# Patient Record
Sex: Female | Born: 1955 | Race: Black or African American | Hispanic: No | Marital: Married | Smoking: Former smoker
Health system: Southern US, Community
[De-identification: ages and names within clinical notes are randomized; demographics above are authoritative.]

## PROBLEM LIST (undated history)

## (undated) DIAGNOSIS — I10 Essential (primary) hypertension: Secondary | ICD-10-CM

## (undated) DIAGNOSIS — Z972 Presence of dental prosthetic device (complete) (partial): Secondary | ICD-10-CM

## (undated) DIAGNOSIS — E119 Type 2 diabetes mellitus without complications: Secondary | ICD-10-CM

## (undated) HISTORY — PX: NO PAST SURGERIES: SHX2092

---

## 1999-02-16 ENCOUNTER — Emergency Department (HOSPITAL_COMMUNITY): Admission: EM | Admit: 1999-02-16 | Discharge: 1999-02-16 | Payer: Self-pay | Admitting: Emergency Medicine

## 2009-01-23 ENCOUNTER — Emergency Department (HOSPITAL_COMMUNITY): Admission: EM | Admit: 2009-01-23 | Discharge: 2009-01-23 | Payer: Self-pay | Admitting: Emergency Medicine

## 2009-02-12 ENCOUNTER — Emergency Department (HOSPITAL_COMMUNITY): Admission: EM | Admit: 2009-02-12 | Discharge: 2009-02-12 | Payer: Self-pay | Admitting: Emergency Medicine
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
## 2010-07-12 LAB — CBC
HCT: 36.9 % (ref 36.0–46.0)
Hemoglobin: 12.1 g/dL (ref 12.0–15.0)
MCHC: 32.9 g/dL (ref 30.0–36.0)
Platelets: 225 10*3/uL (ref 150–400)
RBC: 3.98 MIL/uL (ref 3.87–5.11)
RDW: 14.3 % (ref 11.5–15.5)

## 2010-07-12 LAB — POCT I-STAT, CHEM 8
Chloride: 102 mEq/L (ref 96–112)
Creatinine, Ser: 1 mg/dL (ref 0.4–1.2)
HCT: 39 % (ref 36.0–46.0)
Potassium: 3.4 mEq/L — ABNORMAL LOW (ref 3.5–5.1)
Sodium: 142 mEq/L (ref 135–145)
TCO2: 32 mmol/L (ref 0–100)

## 2010-07-12 LAB — DIFFERENTIAL
Basophils Absolute: 0 10*3/uL (ref 0.0–0.1)
Basophils Relative: 0 % (ref 0–1)
Monocytes Absolute: 0.5 10*3/uL (ref 0.1–1.0)
Monocytes Relative: 5 % (ref 3–12)
Neutro Abs: 7.2 10*3/uL (ref 1.7–7.7)

## 2013-11-09 ENCOUNTER — Other Ambulatory Visit: Payer: Self-pay | Admitting: Orthopedic Surgery

## 2013-11-09 DIAGNOSIS — M25532 Pain in left wrist: Secondary | ICD-10-CM

## 2013-11-18 ENCOUNTER — Ambulatory Visit
Admission: RE | Admit: 2013-11-18 | Discharge: 2013-11-18 | Disposition: A | Discharge status: Home / Self Care | Payer: Worker's Compensation | Source: Ambulatory Visit | Attending: Orthopedic Surgery | Admitting: Orthopedic Surgery

## 2013-11-18 ENCOUNTER — Other Ambulatory Visit: Payer: Self-pay

## 2013-11-18 DIAGNOSIS — M25532 Pain in left wrist: Secondary | ICD-10-CM

## 2014-01-18 ENCOUNTER — Other Ambulatory Visit: Payer: Self-pay | Admitting: Orthopedic Surgery

## 2014-01-18 ENCOUNTER — Encounter (HOSPITAL_BASED_OUTPATIENT_CLINIC_OR_DEPARTMENT_OTHER): Payer: Self-pay | Admitting: *Deleted

## 2014-01-18 NOTE — Progress Notes (Signed)
01/18/14 1208  OBSTRUCTIVE SLEEP APNEA  Have you ever been diagnosed with sleep apnea through a sleep study? No  Do you snore loudly (loud enough to be heard through closed doors)?  1  Do you often feel tired, fatigued, or sleepy during the daytime? 0  Has anyone observed you stop breathing during your sleep? 0  Do you have, or are you being treated for high blood pressure? 1  BMI more than 35 kg/m2? 1  Age over 58 years old? 1  Neck circumference greater than 40 cm/16 inches? 1  Gender: 0  Obstructive Sleep Apnea Score 5  Score 4 or greater  Results sent to PCP

## 2014-01-18 NOTE — Progress Notes (Signed)
Pt has never been sedated for anything-never had ekg-to go to [facility] for ekg-bmet

## 2014-01-19 ENCOUNTER — Encounter (HOSPITAL_COMMUNITY)
Admission: RE | Admit: 2014-01-19 | Discharge: 2014-01-19 | Disposition: A | Discharge status: Home / Self Care | Payer: Worker's Compensation | Source: Ambulatory Visit | Attending: Orthopedic Surgery | Admitting: Orthopedic Surgery

## 2014-01-19 ENCOUNTER — Other Ambulatory Visit: Payer: Self-pay

## 2014-01-19 DIAGNOSIS — G5602 Carpal tunnel syndrome, left upper limb: Secondary | ICD-10-CM | POA: Diagnosis not present

## 2014-01-19 DIAGNOSIS — I10 Essential (primary) hypertension: Secondary | ICD-10-CM | POA: Diagnosis not present

## 2014-01-19 DIAGNOSIS — S62152K Displaced fracture of hook process of hamate [unciform] bone, left wrist, subsequent encounter for fracture with nonunion: Secondary | ICD-10-CM | POA: Diagnosis not present

## 2014-01-19 DIAGNOSIS — R2232 Localized swelling, mass and lump, left upper limb: Secondary | ICD-10-CM | POA: Diagnosis present

## 2014-01-19 DIAGNOSIS — E119 Type 2 diabetes mellitus without complications: Secondary | ICD-10-CM | POA: Diagnosis not present

## 2014-01-19 DIAGNOSIS — Z87891 Personal history of nicotine dependence: Secondary | ICD-10-CM | POA: Diagnosis not present

## 2014-01-19 DIAGNOSIS — M728 Other fibroblastic disorders: Secondary | ICD-10-CM | POA: Diagnosis not present

## 2014-01-19 DIAGNOSIS — X58XXXD Exposure to other specified factors, subsequent encounter: Secondary | ICD-10-CM | POA: Diagnosis not present

## 2014-01-19 LAB — BASIC METABOLIC PANEL
Anion gap: 10 (ref 5–15)
BUN: 11 mg/dL (ref 6–23)
CALCIUM: 9.3 mg/dL (ref 8.4–10.5)
CO2: 27 mEq/L (ref 19–32)
CREATININE: 1.16 mg/dL — AB (ref 0.50–1.10)
Chloride: 107 mEq/L (ref 96–112)
GFR calc non Af Amer: 51 mL/min — ABNORMAL LOW (ref >90)
GFR, EST AFRICAN AMERICAN: 59 mL/min — AB (ref >90)
Glucose, Bld: 129 mg/dL — ABNORMAL HIGH (ref 70–99)
Potassium: 4.3 mEq/L (ref 3.7–5.3)
Sodium: 144 mEq/L (ref 137–147)

## 2014-01-20 ENCOUNTER — Ambulatory Visit (HOSPITAL_BASED_OUTPATIENT_CLINIC_OR_DEPARTMENT_OTHER): Payer: Worker's Compensation | Admitting: Certified Registered"

## 2014-01-20 ENCOUNTER — Ambulatory Visit (HOSPITAL_BASED_OUTPATIENT_CLINIC_OR_DEPARTMENT_OTHER)
Admission: RE | Admit: 2014-01-20 | Discharge: 2014-01-20 | Disposition: A | Discharge status: Home / Self Care | Payer: Worker's Compensation | Source: Ambulatory Visit | Attending: Orthopedic Surgery | Admitting: Orthopedic Surgery

## 2014-01-20 ENCOUNTER — Encounter (HOSPITAL_BASED_OUTPATIENT_CLINIC_OR_DEPARTMENT_OTHER): Payer: Self-pay

## 2014-01-20 ENCOUNTER — Encounter (HOSPITAL_BASED_OUTPATIENT_CLINIC_OR_DEPARTMENT_OTHER)
Admission: RE | Disposition: A | Discharge status: Home / Self Care | Payer: Self-pay | Source: Ambulatory Visit | Attending: Orthopedic Surgery

## 2014-01-20 ENCOUNTER — Encounter (HOSPITAL_BASED_OUTPATIENT_CLINIC_OR_DEPARTMENT_OTHER): Payer: Worker's Compensation | Admitting: Certified Registered"

## 2014-01-20 DIAGNOSIS — G5602 Carpal tunnel syndrome, left upper limb: Secondary | ICD-10-CM | POA: Insufficient documentation

## 2014-01-20 DIAGNOSIS — I10 Essential (primary) hypertension: Secondary | ICD-10-CM | POA: Insufficient documentation

## 2014-01-20 DIAGNOSIS — M728 Other fibroblastic disorders: Secondary | ICD-10-CM | POA: Diagnosis not present

## 2014-01-20 DIAGNOSIS — E119 Type 2 diabetes mellitus without complications: Secondary | ICD-10-CM | POA: Insufficient documentation

## 2014-01-20 DIAGNOSIS — S62152K Displaced fracture of hook process of hamate [unciform] bone, left wrist, subsequent encounter for fracture with nonunion: Secondary | ICD-10-CM | POA: Insufficient documentation

## 2014-01-20 DIAGNOSIS — X58XXXD Exposure to other specified factors, subsequent encounter: Secondary | ICD-10-CM | POA: Insufficient documentation

## 2014-01-20 DIAGNOSIS — Z87891 Personal history of nicotine dependence: Secondary | ICD-10-CM | POA: Insufficient documentation

## 2014-01-20 HISTORY — DX: Essential (primary) hypertension: I10

## 2014-01-20 HISTORY — DX: Type 2 diabetes mellitus without complications: E11.9

## 2014-01-20 HISTORY — DX: Presence of dental prosthetic device (complete) (partial): Z97.2

## 2014-01-20 LAB — GLUCOSE, CAPILLARY
Glucose-Capillary: 107 mg/dL — ABNORMAL HIGH (ref 70–99)
Glucose-Capillary: 112 mg/dL — ABNORMAL HIGH (ref 70–99)

## 2014-01-20 LAB — POCT HEMOGLOBIN-HEMACUE: Hemoglobin: 12.6 g/dL (ref 12.0–15.0)

## 2014-01-20 SURGERY — EXCISION, CARPAL BONE
Anesthesia: General | Site: Wrist | Laterality: Left

## 2014-01-20 MED ORDER — HYDROMORPHONE HCL 2 MG PO TABS: 2.0000 mg | ORAL_TABLET | Freq: Four times a day (QID) | ORAL | Status: AC | PRN

## 2014-01-20 MED ORDER — PROPOFOL 10 MG/ML IV BOLUS
INTRAVENOUS | Status: DC | PRN
  Administered 2014-01-20: 200 mg via INTRAVENOUS

## 2014-01-20 MED ORDER — CHLORHEXIDINE GLUCONATE 4 % EX LIQD: 60.0000 mL | Freq: Once | CUTANEOUS | Status: DC

## 2014-01-20 MED ORDER — DEXAMETHASONE SODIUM PHOSPHATE 10 MG/ML IJ SOLN
INTRAMUSCULAR | Status: DC | PRN
  Administered 2014-01-20: 4 mg via INTRAVENOUS

## 2014-01-20 MED ORDER — FENTANYL CITRATE 0.05 MG/ML IJ SOLN
INTRAMUSCULAR | Status: AC
  Filled 2014-01-20: qty 6

## 2014-01-20 MED ORDER — OXYCODONE HCL 5 MG PO TABS: 5.0000 mg | ORAL_TABLET | Freq: Once | ORAL | Status: DC | PRN

## 2014-01-20 MED ORDER — ONDANSETRON HCL 4 MG/2ML IJ SOLN
INTRAMUSCULAR | Status: DC | PRN
  Administered 2014-01-20: 4 mg via INTRAVENOUS

## 2014-01-20 MED ORDER — BUPIVACAINE-EPINEPHRINE (PF) 0.5% -1:200000 IJ SOLN
INTRAMUSCULAR | Status: DC | PRN
  Administered 2014-01-20: 25 mL via PERINEURAL

## 2014-01-20 MED ORDER — CEFAZOLIN SODIUM-DEXTROSE 2-3 GM-% IV SOLR: 2.0000 g | INTRAVENOUS | Status: DC

## 2014-01-20 MED ORDER — HYDROMORPHONE HCL 1 MG/ML IJ SOLN: 0.2500 mg | INTRAMUSCULAR | Status: DC | PRN

## 2014-01-20 MED ORDER — ONDANSETRON HCL 4 MG/2ML IJ SOLN: 4.0000 mg | Freq: Once | INTRAMUSCULAR | Status: DC | PRN

## 2014-01-20 MED ORDER — OXYCODONE HCL 5 MG/5ML PO SOLN: 5.0000 mg | Freq: Once | ORAL | Status: DC | PRN

## 2014-01-20 MED ORDER — FENTANYL CITRATE 0.05 MG/ML IJ SOLN
INTRAMUSCULAR | Status: AC
  Filled 2014-01-20: qty 2

## 2014-01-20 MED ORDER — CEFAZOLIN SODIUM-DEXTROSE 2-3 GM-% IV SOLR
INTRAVENOUS | Status: AC
  Filled 2014-01-20: qty 50

## 2014-01-20 MED ORDER — TRAMADOL HCL 50 MG PO TABS: ORAL_TABLET | ORAL | Status: AC

## 2014-01-20 MED ORDER — LIDOCAINE HCL (CARDIAC) 20 MG/ML IV SOLN
INTRAVENOUS | Status: DC | PRN
Start: 2014-01-20 — End: 2014-01-20
  Administered 2014-01-20: 100 mg via INTRAVENOUS

## 2014-01-20 MED ORDER — MIDAZOLAM HCL 2 MG/2ML IJ SOLN
1.0000 mg | INTRAMUSCULAR | Status: DC | PRN
  Administered 2014-01-20: 2 mg via INTRAVENOUS

## 2014-01-20 MED ORDER — MIDAZOLAM HCL 2 MG/2ML IJ SOLN
INTRAMUSCULAR | Status: AC
  Filled 2014-01-20: qty 2

## 2014-01-20 MED ORDER — FENTANYL CITRATE 0.05 MG/ML IJ SOLN
50.0000 ug | INTRAMUSCULAR | Status: DC | PRN
  Administered 2014-01-20: 100 ug via INTRAVENOUS

## 2014-01-20 MED ORDER — LACTATED RINGERS IV SOLN
INTRAVENOUS | Status: DC
  Administered 2014-01-20 (×2): via INTRAVENOUS

## 2014-01-20 SURGICAL SUPPLY — 54 items
BANDAGE ELASTIC 3 VELCRO ST LF (GAUZE/BANDAGES/DRESSINGS) ×3 IMPLANT
BLADE CCA MICRO SAG (BLADE) ×1 IMPLANT
BLADE MINI RND TIP GREEN BEAV (BLADE) ×5 IMPLANT
BLADE SURG 15 STRL LF DISP TIS (BLADE) ×2 IMPLANT
BLADE SURG 15 STRL SS (BLADE) ×6
BNDG CMPR 9X4 STRL LF SNTH (GAUZE/BANDAGES/DRESSINGS) ×1
BNDG ESMARK 4X9 LF (GAUZE/BANDAGES/DRESSINGS) ×3 IMPLANT
BNDG GAUZE ELAST 4 BULKY (GAUZE/BANDAGES/DRESSINGS) ×3 IMPLANT
CHLORAPREP W/TINT 26ML (MISCELLANEOUS) ×3 IMPLANT
CORDS BIPOLAR (ELECTRODE) ×3 IMPLANT
COVER BACK TABLE 60X90IN (DRAPES) ×3 IMPLANT
COVER MAYO STAND STRL (DRAPES) ×3 IMPLANT
CUFF TOURNIQUET SINGLE 18IN (TOURNIQUET CUFF) IMPLANT
CUFF TOURNIQUET SINGLE 24IN (TOURNIQUET CUFF) ×2 IMPLANT
DECANTER SPIKE VIAL GLASS SM (MISCELLANEOUS) IMPLANT
DRAPE EXTREMITY TIBURON (DRAPES) ×3 IMPLANT
DRAPE OEC MINIVIEW 54X84 (DRAPES) ×3 IMPLANT
DRAPE SURG 17X23 STRL (DRAPES) ×3 IMPLANT
GAUZE SPONGE 4X4 12PLY STRL (GAUZE/BANDAGES/DRESSINGS) ×3 IMPLANT
GAUZE XEROFORM 1X8 LF (GAUZE/BANDAGES/DRESSINGS) ×3 IMPLANT
GLOVE BIO SURGEON STRL SZ 6.5 (GLOVE) ×3 IMPLANT
GLOVE BIO SURGEON STRL SZ7.5 (GLOVE) ×3 IMPLANT
GLOVE BIO SURGEONS STRL SZ 6.5 (GLOVE) ×3
GLOVE BIOGEL PI IND STRL 7.0 (GLOVE) IMPLANT
GLOVE BIOGEL PI IND STRL 8 (GLOVE) ×1 IMPLANT
GLOVE BIOGEL PI IND STRL 8.5 (GLOVE) IMPLANT
GLOVE BIOGEL PI INDICATOR 7.0 (GLOVE) ×6
GLOVE BIOGEL PI INDICATOR 8 (GLOVE) ×2
GLOVE BIOGEL PI INDICATOR 8.5 (GLOVE) ×2
GLOVE ECLIPSE 6.5 STRL STRAW (GLOVE) ×2 IMPLANT
GLOVE SURG ORTHO 8.0 STRL STRW (GLOVE) ×3 IMPLANT
GOWN STRL REUS W/ TWL LRG LVL3 (GOWN DISPOSABLE) ×1 IMPLANT
GOWN STRL REUS W/TWL LRG LVL3 (GOWN DISPOSABLE) ×9
GOWN STRL REUS W/TWL XL LVL3 (GOWN DISPOSABLE) ×6 IMPLANT
NS IRRIG 1000ML POUR BTL (IV SOLUTION) ×3 IMPLANT
PACK BASIN DAY SURGERY FS (CUSTOM PROCEDURE TRAY) ×3 IMPLANT
PAD CAST 3X4 CTTN HI CHSV (CAST SUPPLIES) ×1 IMPLANT
PADDING CAST ABS 3INX4YD NS (CAST SUPPLIES)
PADDING CAST ABS 4INX4YD NS (CAST SUPPLIES)
PADDING CAST ABS COTTON 3X4 (CAST SUPPLIES) IMPLANT
PADDING CAST ABS COTTON 4X4 ST (CAST SUPPLIES) ×1 IMPLANT
PADDING CAST COTTON 3X4 STRL (CAST SUPPLIES) ×3
SLEEVE SCD COMPRESS KNEE MED (MISCELLANEOUS) ×4 IMPLANT
SLING ARM LRG ADULT FOAM STRAP (SOFTGOODS) ×2 IMPLANT
SPLINT PLASTER CAST XFAST 3X15 (CAST SUPPLIES) ×10 IMPLANT
SPLINT PLASTER XTRA FASTSET 3X (CAST SUPPLIES) ×20
STOCKINETTE 4X48 STRL (DRAPES) ×3 IMPLANT
SUT ETHILON 4 0 PS 2 18 (SUTURE) ×3 IMPLANT
SUT VIC AB 4-0 P2 18 (SUTURE) ×1 IMPLANT
SUT VICRYL 4-0 PS2 18IN ABS (SUTURE) ×1 IMPLANT
SYR BULB 3OZ (MISCELLANEOUS) ×3 IMPLANT
SYR CONTROL 10ML LL (SYRINGE) IMPLANT
TOWEL OR 17X24 6PK STRL BLUE (TOWEL DISPOSABLE) ×3 IMPLANT
UNDERPAD 30X30 INCONTINENT (UNDERPADS AND DIAPERS) ×1 IMPLANT

## 2014-01-20 NOTE — Progress Notes (Signed)
  Assisted Dr. Crews with left, ultrasound guided, supraclavicular block. Side rails up, monitors on throughout procedure. See vital signs in flow sheet. Tolerated Procedure well. 

## 2014-01-20 NOTE — Op Note (Signed)
NAME:  [name] Jordan, [name] Jordan              ACCOUNT NO.:  [id number]  MEDICAL RECORD NO.:  [id number]  LOCATION:                               FACILITY:  [facility]  PHYSICIAN:  Xaniyah Buchholz, MD        DATE OF BIRTH:  09/21/1955  DATE OF PROCEDURE:  01/20/2014 DATE OF DISCHARGE:  01/20/2014                              OPERATIVE REPORT   PREOPERATIVE DIAGNOSIS:  Left palm mass and trapezial ridge and hook of hamate fracture.  POSTOPERATIVE DIAGNOSIS:  Left palm mass and trapezial ridge and hook of hamate fracture.  PROCEDURE:   1. Excision of left hook of hamate for fracture 2. Excision of left trapezial ridge fracture fragments 3. Left carpal tunnel release 4. Excision of mass left palm through a separate incision.  SURGEON:  Ranard Harte, MD.  ASSISTANT:  Gary Buford Bremer, MD.  ANESTHESIA:  General with regional.  IV FLUIDS:  Per anesthesia flow sheet.  ESTIMATED BLOOD LOSS:  Minimal.  COMPLICATIONS:  None.  SPECIMENS:  Left palm mass to Pathology.  TOURNIQUET TIME:  71 minutes.  DISPOSITION:  Stable to PACU.  INDICATIONS:  Ms. Pullin is a 58-year-old female who states she fell at work a few months ago injuring her left hand.  She tried nonoperative treatment with splinting.  She continues to have pain in the hand.  A CT reveals a hook of hamate and trapezial ridge fracture.  She has also noted a mass in the palm of the hand.  We discussed treatment options. She elected to proceed with operative excision of hook of hamate and trapezial ridge fractures and excision of the mass.  Risks, benefits, and alternatives of surgery were discussed including risk of blood loss, infection, damage to nerves, vessels, tendons, ligaments, bone; failure to surgery, need for additional surgery, complications with wound healing, continued pain, need for further surgeries.  She voiced understanding of these and elected to proceed.  We also discussed potential need for release of the carpal tunnel  during the procedure.  OPERATIVE COURSE:  After being identified preoperatively by myself, the patient and I agreed upon procedure and site of procedure.  Surgical site was marked.  The risks, benefits, and alternatives of surgery were reviewed and she wished to proceed.  Surgical consent had been signed. She was given IV Ancef as preoperative antibiotic prophylaxis.  She was transferred to the operating room and placed on the operating table in supine position with left upper extremity on arm board.  General anesthesia was induced by anesthesiologist.  A regional block was then performed by anesthesia in preoperative holding.  Left upper extremity was prepped and draped in normal sterile orthopedic fashion.  Surgical pause was performed between surgeons, anesthesia, operating staff, and all were in agreement as to the patient, procedure, site of procedure. Tourniquet at the proximal aspect of the extremity was inflated to 250 mmHg after exsanguination of the limb with Esmarch bandage.  Incision was made over the mass in the distal aspect of the palm.  This was carried into subcutaneous tissues by spreading technique.  The mass was easily identified.  It was freed of its soft tissue attachments and removed.  It was sent   to Pathology for examination.  The skin was palpated and no masses remaining.  The wound was irrigated and closed with 4-0 nylon in a horizontal mattress fashion.  A separate incision was made in the proximal aspect of the palm over the transverse carpal ligament.  This was carried into subcutaneous tissues by spreading technique.  The ulnar nerve and artery were identified through Guyon's canal and the canal released.  The motor branch was identified as it coursed deep.  This branch was released as well.  Great care was taken to protect it throughout the case.  The hamate hook was identified.  It was freed up from soft tissue attachments including the transverse carpal  ligament.  It was then isolated subperiosteally.  Once it had been isolated, rongeurs were used to excise the entire hook of the hamate down to the base.  A finger was placed into the wound to ensure no sharp bridges were remaining.  The trapezial ridge was addressed next.  The wound was extended proximally to identify the flexor carpi radialis tendon.  This was used to localize the trapezial ridge.  The C- arm was used in AP and lateral projections to aid in this.  It was felt that region of the trapezial ridge through the carpal tunnel would be the easiest way to get to the fracture fragments.  The transverse carpal ligament was released in its entirety.  Great care was taken to ensure complete decompression.  The motor branches of the median nerve was identified and was intact.  The distal aspect of volar antebrachial fascia was released with the scissors as well.  The palmar cutaneous branch had been identified through the proximal aspect of the incision and protected throughout the case.  The trapezial ridge was isolated from within the carpal tunnel.  The fracture nonunion was identified. The ridge was removed with the rongeurs.  Care was taken to remove all fracture fragments.  The C-arm was used in a carpal tunnel view to ensure appropriate position of the hook of the hamate and the trapezial ridge fragments which was the case.  The wound was copiously irrigated with sterile saline.  It was closed with 4-0 nylon in a horizontal mattress fashion.  It was dressed with sterile Xeroform, 4x4s, and wrapped with a Kerlix bandage.  A volar splint was placed and wrapped with Kerlix and Ace bandage.  Tourniquet was deflated at 71 minutes. Fingertips were pink with brisk capillary refill after deflation of tourniquet.  The operative drapes were broken down, and the patient was awoken from anesthesia safely.  She was transferred back to stretcher and taken to PACU in stable condition.  I will  see her back in the office in 1 week for postoperative followup.  I will give her tramadol 50 mg 1-2 p.o. q.6 hours p.r.n. pain, dispensed #40.  She states that hydrocodone and oxycodone are not effective for her.  I will also write her Dilaudid 2 mg p.o. q.6 hours p.r.n. breakthrough pain dispensed #15 in case the tramadol is not effective.     Betha LoaKevin Kiing Deakin, MD     KK/MEDQ  D:  01/20/2014  T:  01/20/2014  Job:  [id number]

## 2014-01-20 NOTE — Op Note (Deleted)
NAMAnnye Jordan:  Jordan, [name]              ACCOUNT NO.:  [id number]  MEDICAL RECORD NO.:  [id number]  LOCATION:                               FACILITY:  [facility]  PHYSICIAN:  Betha LoaKevin Timera Windt, MD        DATE OF BIRTH:  1955-09-25  DATE OF PROCEDURE:  01/20/2014 DATE OF DISCHARGE:  01/20/2014                              OPERATIVE REPORT   PREOPERATIVE DIAGNOSIS:  Left palm mass and trapezial ridge and hook of hamate fracture.  POSTOPERATIVE DIAGNOSIS:  Left palm mass and trapezial ridge and hook of hamate fracture.  PROCEDURE:   1. Excision of left hook of hamate for fracture 2. Excision of left trapezial ridge fracture fragments 3. Left carpal tunnel release 4. Excision of mass left palm through a separate incision.  SURGEON:  Betha LoaKevin Koki Buxton, MD.  ASSISTANT:  Cindee SaltGary Graclyn Lawther, MD.  ANESTHESIA:  General with regional.  IV FLUIDS:  Per anesthesia flow sheet.  ESTIMATED BLOOD LOSS:  Minimal.  COMPLICATIONS:  None.  SPECIMENS:  Left palm mass to Pathology.  TOURNIQUET TIME:  71 minutes.  DISPOSITION:  Stable to PACU.  INDICATIONS:  Ms. [name] Jordan is a 58 year old female who states she fell at work a few months ago injuring her left hand.  She tried nonoperative treatment with splinting.  She continues to have pain in the hand.  A CT reveals a hook of hamate and trapezial ridge fracture.  She has also noted a mass in the palm of the hand.  We discussed treatment options. She elected to proceed with operative excision of hook of hamate and trapezial ridge fractures and excision of the mass.  Risks, benefits, and alternatives of surgery were discussed including risk of blood loss, infection, damage to nerves, vessels, tendons, ligaments, bone; failure to surgery, need for additional surgery, complications with wound healing, continued pain, need for further surgeries.  She voiced understanding of these and elected to proceed.  We also discussed potential need for release of the carpal tunnel  during the procedure.  OPERATIVE COURSE:  After being identified preoperatively by myself, the patient and I agreed upon procedure and site of procedure.  Surgical site was marked.  The risks, benefits, and alternatives of surgery were reviewed and she wished to proceed.  Surgical consent had been signed. She was given IV Ancef as preoperative antibiotic prophylaxis.  She was transferred to the operating room and placed on the operating table in supine position with left upper extremity on arm board.  General anesthesia was induced by anesthesiologist.  A regional block was then performed by anesthesia in preoperative holding.  Left upper extremity was prepped and draped in normal sterile orthopedic fashion.  Surgical pause was performed between surgeons, anesthesia, operating staff, and all were in agreement as to the patient, procedure, site of procedure. Tourniquet at the proximal aspect of the extremity was inflated to 250 mmHg after exsanguination of the limb with Esmarch bandage.  Incision was made over the mass in the distal aspect of the palm.  This was carried into subcutaneous tissues by spreading technique.  The mass was easily identified.  It was freed of its soft tissue attachments and removed.  It was sent  to Pathology for examination.  The skin was palpated and no masses remaining.  The wound was irrigated and closed with 4-0 nylon in a horizontal mattress fashion.  A separate incision was made in the proximal aspect of the palm over the transverse carpal ligament.  This was carried into subcutaneous tissues by spreading technique.  The ulnar nerve and artery were identified through Guyon's canal and the canal released.  The motor branch was identified as it coursed deep.  This branch was released as well.  Great care was taken to protect it throughout the case.  The hamate hook was identified.  It was freed up from soft tissue attachments including the transverse carpal  ligament.  It was then isolated subperiosteally.  Once it had been isolated, rongeurs were used to excise the entire hook of the hamate down to the base.  A finger was placed into the wound to ensure no sharp bridges were remaining.  The trapezial ridge was addressed next.  The wound was extended proximally to identify the flexor carpi radialis tendon.  This was used to localize the trapezial ridge.  The C- arm was used in AP and lateral projections to aid in this.  It was felt that region of the trapezial ridge through the carpal tunnel would be the easiest way to get to the fracture fragments.  The transverse carpal ligament was released in its entirety.  Great care was taken to ensure complete decompression.  The motor branches of the median nerve was identified and was intact.  The distal aspect of volar antebrachial fascia was released with the scissors as well.  The palmar cutaneous branch had been identified through the proximal aspect of the incision and protected throughout the case.  The trapezial ridge was isolated from within the carpal tunnel.  The fracture nonunion was identified. The ridge was removed with the rongeurs.  Care was taken to remove all fracture fragments.  The C-arm was used in a carpal tunnel view to ensure appropriate position of the hook of the hamate and the trapezial ridge fragments which was the case.  The wound was copiously irrigated with sterile saline.  It was closed with 4-0 nylon in a horizontal mattress fashion.  It was dressed with sterile Xeroform, 4x4s, and wrapped with a Kerlix bandage.  A volar splint was placed and wrapped with Kerlix and Ace bandage.  Tourniquet was deflated at 71 minutes. Fingertips were pink with brisk capillary refill after deflation of tourniquet.  The operative drapes were broken down, and the patient was awoken from anesthesia safely.  She was transferred back to stretcher and taken to PACU in stable condition.  I will  see her back in the office in 1 week for postoperative followup.  I will give her tramadol 50 mg 1-2 p.o. q.6 hours p.r.n. pain, dispensed #40.  She states that hydrocodone and oxycodone are not effective for her.  I will also write her Dilaudid 2 mg p.o. q.6 hours p.r.n. breakthrough pain dispensed #15 in case the tramadol is not effective.     Betha LoaKevin Arick Mareno, MD     KK/MEDQ  D:  01/20/2014  T:  01/20/2014  Job:  [id number]

## 2014-01-20 NOTE — Discharge Instructions (Addendum)
[facility] Instructions °Hand Surgery ° °Wound Care: °Keep your hand elevated above the level of your heart.  Do not allow it to dangle by your side.  Keep the dressing dry and do not remove it unless your doctor advises you to do so.  He will usually change it at the time of your post-op visit.  Moving your fingers is advised to stimulate circulation but will depend on the site of your surgery.  If you have a splint applied, your doctor will advise you regarding movement. ° °Activity: °Do not drive or operate machinery today.  Rest today and then you may return to your normal activity and work as indicated by your physician. ° °Diet:  °Drink liquids today or eat a light diet.  You may resume a regular diet tomorrow.   ° °General expectations: °Pain for two to three days. °Fingers may become slightly swollen. ° °Call your doctor if any of the following occur: °Severe pain not relieved by pain medication. °Elevated temperature. °Dressing soaked with blood. °Inability to move fingers. °White or bluish color to fingers. ° ° °Post Anesthesia Home Care Instructions ° °Activity: °Get plenty of rest for the remainder of the day. A responsible adult should stay with you for 24 hours following the procedure.  °For the next 24 hours, DO NOT: °-Drive a car °-Operate machinery °-Drink alcoholic beverages °-Take any medication unless instructed by your physician °-Make any legal decisions or sign important papers. ° °Meals: °Start with liquid foods such as gelatin or soup. Progress to regular foods as tolerated. Avoid greasy, spicy, heavy foods. If nausea and/or vomiting occur, drink only clear liquids until the nausea and/or vomiting subsides. Call your physician if vomiting continues. ° °Special Instructions/Symptoms: °Your throat may feel dry or sore from the anesthesia or the breathing tube placed in your throat during surgery. If this causes discomfort, gargle with warm salt water. The discomfort should disappear within 24  hours. ° ° °Regional Anesthesia Blocks ° °1. Numbness or the inability to move the "blocked" extremity may last from 3-48 hours after placement. The length of time depends on the medication injected and your individual response to the medication. If the numbness is not going away after 48 hours, call your surgeon. ° °2. The extremity that is blocked will need to be protected until the numbness is gone and the  Strength has returned. Because you cannot feel it, you will need to take extra care to avoid injury. Because it may be weak, you may have difficulty moving it or using it. You may not know what position it is in without looking at it while the block is in effect. ° °3. For blocks in the legs and feet, returning to weight bearing and walking needs to be done carefully. You will need to wait until the numbness is entirely gone and the strength has returned. You should be able to move your leg and foot normally before you try and bear weight or walk. You will need someone to be with you when you first try to ensure you do not fall and possibly risk injury. ° °4. Bruising and tenderness at the needle site are common side effects and will resolve in a few days. ° °5. Persistent numbness or new problems with movement should be communicated to the surgeon or the [facility] ([phone])/ [facility] ([phone]). °

## 2014-01-20 NOTE — Brief Op Note (Signed)
01/20/2014  1:44 PM  PATIENT:  [name] Jordan  58 y.o. female  PRE-OPERATIVE DIAGNOSIS:  LEFT TRAPEZIAL RIDGE FRACTURE/HOOK OF HAMATE FRACTURE/PALM MASS  POST-OPERATIVE DIAGNOSIS:  LEFT TRAPEZIAL RIDGE FRACTURE/HOOK OF HAMATE FRACTURE/PALM MASS  PROCEDURE:  Procedure(s): EXCISION LEFT HOOK OF HUMATE/TRAPEZIAL RIDGE FRACTURE/EXCISION PALM MASS (Left), carpal tunnel release  SURGEON:  Surgeon(s) and Role:    * Betha LoaKevin Shann Lewellyn, MD - Primary    * Cindee SaltGary Syniah Berne, MD - Assisting  PHYSICIAN ASSISTANT:   ASSISTANTS: Cindee SaltGary Joella Saefong, MD   ANESTHESIA:   regional and general  EBL:  Total I/O In: 1000 [I.V.:1000] Out: -   BLOOD ADMINISTERED:none  DRAINS: none   LOCAL MEDICATIONS USED:  NONE  SPECIMEN:  Source of Specimen:  left palm  DISPOSITION OF SPECIMEN:  PATHOLOGY  COUNTS:  YES  TOURNIQUET:  71 minutes at 250 mmHg  DICTATION: .Other Dictation: Dictation Number [id number]  PLAN OF CARE: Discharge to home after PACU  PATIENT DISPOSITION:  PACU - hemodynamically stable.

## 2014-01-20 NOTE — Transfer of Care (Signed)
Immediate Anesthesia Transfer of Care Note  Patient: [name] Jordan  Procedure(s) Performed: Procedure(s): EXCISION LEFT HOOK OF HUMATE/TRAPEZIAL RIDGE FRACTURE/EXCISION PALM MASS (Left)  Patient Location: PACU  Anesthesia Type:General and Regional  Level of Consciousness: awake and alert   Airway & Oxygen Therapy: Patient Spontanous Breathing and Patient connected to face mask oxygen  Post-op Assessment: Report given to PACU RN and Post -op Vital signs reviewed and stable  Post vital signs: Reviewed and stable  Complications: No apparent anesthesia complications

## 2014-01-20 NOTE — Anesthesia Preprocedure Evaluation (Addendum)
Anesthesia Evaluation  Patient identified by MRN, date of birth, ID band Patient awake    Reviewed: Allergy & Precautions, H&P , NPO status , Patient's Chart, lab work & pertinent test results  Airway Mallampati: I TM Distance: >3 FB Neck ROM: Full    Dental  (+) Teeth Intact, Dental Advisory Given   Pulmonary former smoker,  breath sounds clear to auscultation        Cardiovascular hypertension, Pt. on medications Rhythm:Regular Rate:Normal     Neuro/Psych    GI/Hepatic   Endo/Other  diabetesMorbid obesity  Renal/GU      Musculoskeletal   Abdominal   Peds  Hematology   Anesthesia Other Findings   Reproductive/Obstetrics                          Anesthesia Physical Anesthesia Plan  ASA: III  Anesthesia Plan: General   Post-op Pain Management:    Induction: Intravenous  Airway Management Planned: LMA  Additional Equipment:   Intra-op Plan:   Post-operative Plan: Extubation in OR  Informed Consent: I have reviewed the patients History and Physical, chart, labs and discussed the procedure including the risks, benefits and alternatives for the proposed anesthesia with the patient or authorized representative who has indicated his/her understanding and acceptance.   Dental advisory given  Plan Discussed with: CRNA, Anesthesiologist and Surgeon  Anesthesia Plan Comments:         Anesthesia Quick Evaluation

## 2014-01-20 NOTE — Op Note (Signed)
[id number] 

## 2014-01-20 NOTE — Anesthesia Postprocedure Evaluation (Signed)
  Anesthesia Post-op Note  Patient: [name] HongWanda D Jordan  Procedure(s) Performed: Procedure(s): EXCISION LEFT HOOK OF HUMATE/TRAPEZIAL RIDGE FRACTURE/EXCISION PALM MASS (Left)  Patient Location: PACU  Anesthesia Type:GA combined with regional for post-op pain  Level of Consciousness: awake, alert  and oriented  Airway and Oxygen Therapy: Patient Spontanous Breathing  Post-op Pain: none  Post-op Assessment: Post-op Vital signs reviewed  Post-op Vital Signs: Reviewed  Last Vitals:  Filed Vitals:   01/20/14 1454  BP: 186/83  Pulse: 51  Temp: 36.4 C  Resp: 18    Complications: No apparent anesthesia complications

## 2014-01-20 NOTE — Anesthesia Procedure Notes (Addendum)
Anesthesia Regional Block:  Supraclavicular block  Pre-Anesthetic Checklist: ,, timeout performed, Correct Patient, Correct Site, Correct Laterality, Correct Procedure, Correct Position, site marked, Risks and benefits discussed,  Surgical consent,  Pre-op evaluation,  At surgeon's request and post-op pain management  Laterality: Left and Upper  Prep: chloraprep       Needles:  Injection technique: Single-shot  Needle Type: Echogenic Stimulator Needle     Needle Length: 5cm 5 cm Needle Gauge: 21 and 21 G    Additional Needles:  Procedures: ultrasound guided (picture in chart) Supraclavicular block Narrative:  Start time: 01/20/2014 11:20 AM End time: 01/20/2014 11:27 AM Injection made incrementally with aspirations every 5 mL.  Performed by: Personally  Anesthesiologist: Sheldon Silvanavid Crews   Procedure Name: LMA Insertion Date/Time: 01/20/2014 12:17 PM Performed by: Curly ShoresRAFT, Juron Vorhees W Pre-anesthesia Checklist: Patient identified, Emergency Drugs available, Suction available and Patient being monitored Patient Re-evaluated:Patient Re-evaluated prior to inductionOxygen Delivery Method: Circle System Utilized Preoxygenation: Pre-oxygenation with 100% oxygen Intubation Type: IV induction Ventilation: Mask ventilation without difficulty LMA: LMA inserted LMA Size: 4.0 Number of attempts: 1 Airway Equipment and Method: bite block Placement Confirmation: positive ETCO2 and breath sounds checked- equal and bilateral Tube secured with: Tape Dental Injury: Teeth and Oropharynx as per pre-operative assessment

## 2014-01-20 NOTE — H&P (Signed)
[name] Jordan is an 58 y.o. female.   Chief Complaint: left trapezial ridge and hook of hamate fractures HPI: 58 yo rhd female states she fell at work 10/20/13 onto her left hand.  Has been splinted but continued pain in left wrist.  CT shows trapezial ridge and hook of hamate fractures.  She wishes to have these excised to help manage pain.  Past Medical History  Diagnosis Date  . Diabetes mellitus without complication   . Hypertension   . Wears partial dentures     top/bottom    Past Surgical History  Procedure Laterality Date  . No past surgeries      History reviewed. No pertinent family history. Social History:  reports that she quit smoking about 8 years ago. She does not have any smokeless tobacco history on file. She reports that she does not drink alcohol or use illicit drugs.  Allergies: No Known Allergies  Medications Prior to Admission  Medication Sig Dispense Refill  . amLODipine (NORVASC) 2.5 MG tablet Take 2.5 mg by mouth daily.      Marland Kitchen aspirin 81 MG tablet Take 81 mg by mouth daily.      . cloNIDine (CATAPRES) 0.3 MG tablet Take 0.3 mg by mouth 2 (two) times daily.      . metFORMIN (GLUCOPHAGE) 500 MG tablet Take by mouth daily with breakfast.      . pantoprazole (PROTONIX) 40 MG tablet Take 40 mg by mouth daily.        Results for orders placed during the hospital encounter of 01/19/14 (from the past 48 hour(s))  BASIC METABOLIC PANEL     Status: Abnormal   Collection Time    01/19/14 10:40 AM      Result Value Ref Range   Sodium 144  137 - 147 mEq/L   Potassium 4.3  3.7 - 5.3 mEq/L   Chloride 107  96 - 112 mEq/L   CO2 27  19 - 32 mEq/L   Glucose, Bld 129 (*) 70 - 99 mg/dL   BUN 11  6 - 23 mg/dL   Creatinine, Ser 1.16 (*) 0.50 - 1.10 mg/dL   Calcium 9.3  8.4 - 10.5 mg/dL   GFR calc non Af Amer 51 (*) >90 mL/min   GFR calc Af Amer 59 (*) >90 mL/min   Comment: (NOTE)     The eGFR has been calculated using the CKD EPI equation.     This calculation has  not been validated in all clinical situations.     eGFR's persistently <90 mL/min signify possible Chronic Kidney     Disease.   Anion gap 10  5 - 15    No results found.   A comprehensive review of systems was negative.  Height 5' 5"  (1.651 m), weight 122.471 kg (270 lb).  General appearance: alert, cooperative and appears stated age Head: Normocephalic, without obvious abnormality, atraumatic Neck: supple, symmetrical, trachea midline Resp: clear to auscultation bilaterally Cardio: regular rate and rhythm GI: non tender Extremities: intact sensation and capillary refill all digits. +epl/fpl/io.  ttp trapezial ridge and hook of hamate.  no wounds. Pulses: 2+ and symmetric Skin: Skin color, texture, turgor normal. No rashes or lesions Neurologic: Grossly normal Incision/Wound: none  Assessment/Plan Left trapezial ridge and hook of hamate fractures.  Non operative and operative treatment options were discussed with the patient and patient wishes to proceed with operative treatment. Plan excision trapezial ridge fragments and hook of hamate.  Risks, benefits, and alternatives of  surgery were discussed and the patient agrees with the plan of care.   Bon Dowis R 01/20/2014, 10:35 AM

## 2014-01-20 NOTE — Op Note (Signed)
Intra-operative fluoroscopic images in the AP, lateral, oblique, and carpal tunnel views were taken and evaluated by myself.  Localization and removal of the hook of the hamate and trapezial ridge fracture fragments was confirmed.

## 2014-07-08 ENCOUNTER — Other Ambulatory Visit: Payer: Self-pay | Admitting: Orthopedic Surgery

## 2014-07-08 DIAGNOSIS — M25532 Pain in left wrist: Secondary | ICD-10-CM

## 2014-07-11 ENCOUNTER — Ambulatory Visit
Admission: RE | Admit: 2014-07-11 | Discharge: 2014-07-11 | Disposition: A | Discharge status: Home / Self Care | Payer: Worker's Compensation | Source: Ambulatory Visit | Attending: Orthopedic Surgery | Admitting: Orthopedic Surgery

## 2014-07-11 DIAGNOSIS — M25532 Pain in left wrist: Secondary | ICD-10-CM

## 2016-01-17 ENCOUNTER — Telehealth (HOSPITAL_COMMUNITY): Payer: Self-pay | Admitting: *Deleted

## 2016-01-17 NOTE — Telephone Encounter (Signed)
Phone call regarding an appointment.   music background, no answer.

## 2016-01-18 ENCOUNTER — Telehealth (HOSPITAL_COMMUNITY): Payer: Self-pay | Admitting: *Deleted

## 2016-01-18 NOTE — Telephone Encounter (Signed)
Called pt 01-17-2016 to sch appt due to ref from Dr. Sherryll BurgerShah. When staff asked pt to verify insurance information, pt stated he insurance card is in her car and for staff to hold while she goes outside to get it. Staff informed pt that due to office being extremely busy at this time, staff would call pt back. Pt agreed and verbalized understanding. On 01-18-2016, staff called pt to sch new pt appt and pt stated she was still in bed and do not have her insurance card around her. Staff then stated for pt to call office back when she gets up and have her insurance card and pt agreed and verbalized understanding.

## 2016-01-24 ENCOUNTER — Telehealth (HOSPITAL_COMMUNITY): Payer: Self-pay | Admitting: *Deleted

## 2016-01-24 NOTE — Telephone Encounter (Signed)
phone call from patient to cancel appointment.   She said she will call back to reschedule.

## 2016-02-06 ENCOUNTER — Ambulatory Visit (HOSPITAL_COMMUNITY): Payer: Self-pay | Admitting: Psychiatry

## 2019-05-25 DIAGNOSIS — Z79899 Other long term (current) drug therapy: Secondary | ICD-10-CM | POA: Diagnosis not present

## 2019-05-25 DIAGNOSIS — E7800 Pure hypercholesterolemia, unspecified: Secondary | ICD-10-CM | POA: Diagnosis not present

## 2019-05-25 DIAGNOSIS — Z0000 Encounter for general adult medical examination without abnormal findings: Secondary | ICD-10-CM | POA: Diagnosis not present

## 2019-05-25 DIAGNOSIS — Z1211 Encounter for screening for malignant neoplasm of colon: Secondary | ICD-10-CM | POA: Diagnosis not present

## 2019-05-25 DIAGNOSIS — Z299 Encounter for prophylactic measures, unspecified: Secondary | ICD-10-CM | POA: Diagnosis not present

## 2019-05-25 DIAGNOSIS — R5383 Other fatigue: Secondary | ICD-10-CM | POA: Diagnosis not present

## 2019-05-25 DIAGNOSIS — Z7189 Other specified counseling: Secondary | ICD-10-CM | POA: Diagnosis not present

## 2019-05-25 DIAGNOSIS — E559 Vitamin D deficiency, unspecified: Secondary | ICD-10-CM | POA: Diagnosis not present

## 2019-05-25 DIAGNOSIS — R350 Frequency of micturition: Secondary | ICD-10-CM | POA: Diagnosis not present

## 2019-05-25 DIAGNOSIS — E1165 Type 2 diabetes mellitus with hyperglycemia: Secondary | ICD-10-CM | POA: Diagnosis not present

## 2019-06-07 DIAGNOSIS — I5030 Unspecified diastolic (congestive) heart failure: Secondary | ICD-10-CM | POA: Diagnosis not present

## 2019-08-24 DIAGNOSIS — I509 Heart failure, unspecified: Secondary | ICD-10-CM | POA: Diagnosis not present

## 2019-08-24 DIAGNOSIS — Z87891 Personal history of nicotine dependence: Secondary | ICD-10-CM | POA: Diagnosis not present

## 2019-08-24 DIAGNOSIS — I10 Essential (primary) hypertension: Secondary | ICD-10-CM | POA: Diagnosis not present

## 2019-08-24 DIAGNOSIS — E1165 Type 2 diabetes mellitus with hyperglycemia: Secondary | ICD-10-CM | POA: Diagnosis not present

## 2019-08-24 DIAGNOSIS — Z299 Encounter for prophylactic measures, unspecified: Secondary | ICD-10-CM | POA: Diagnosis not present

## 2019-08-24 DIAGNOSIS — E1122 Type 2 diabetes mellitus with diabetic chronic kidney disease: Secondary | ICD-10-CM | POA: Diagnosis not present

## 2019-10-06 DIAGNOSIS — N183 Chronic kidney disease, stage 3 unspecified: Secondary | ICD-10-CM | POA: Diagnosis not present

## 2019-10-06 DIAGNOSIS — I5032 Chronic diastolic (congestive) heart failure: Secondary | ICD-10-CM | POA: Diagnosis not present

## 2019-10-06 DIAGNOSIS — E1122 Type 2 diabetes mellitus with diabetic chronic kidney disease: Secondary | ICD-10-CM | POA: Diagnosis not present

## 2019-10-06 DIAGNOSIS — I130 Hypertensive heart and chronic kidney disease with heart failure and stage 1 through stage 4 chronic kidney disease, or unspecified chronic kidney disease: Secondary | ICD-10-CM | POA: Diagnosis not present

## 2019-11-25 DIAGNOSIS — E1165 Type 2 diabetes mellitus with hyperglycemia: Secondary | ICD-10-CM | POA: Diagnosis not present

## 2019-11-25 DIAGNOSIS — I509 Heart failure, unspecified: Secondary | ICD-10-CM | POA: Diagnosis not present

## 2019-11-25 DIAGNOSIS — N183 Chronic kidney disease, stage 3 unspecified: Secondary | ICD-10-CM | POA: Diagnosis not present

## 2019-11-25 DIAGNOSIS — I10 Essential (primary) hypertension: Secondary | ICD-10-CM | POA: Diagnosis not present

## 2019-11-25 DIAGNOSIS — E1122 Type 2 diabetes mellitus with diabetic chronic kidney disease: Secondary | ICD-10-CM | POA: Diagnosis not present

## 2019-11-25 DIAGNOSIS — Z299 Encounter for prophylactic measures, unspecified: Secondary | ICD-10-CM | POA: Diagnosis not present

## 2019-12-27 DIAGNOSIS — K5900 Constipation, unspecified: Secondary | ICD-10-CM | POA: Diagnosis not present

## 2019-12-27 DIAGNOSIS — Z299 Encounter for prophylactic measures, unspecified: Secondary | ICD-10-CM | POA: Diagnosis not present

## 2019-12-27 DIAGNOSIS — I10 Essential (primary) hypertension: Secondary | ICD-10-CM | POA: Diagnosis not present

## 2019-12-27 DIAGNOSIS — E1165 Type 2 diabetes mellitus with hyperglycemia: Secondary | ICD-10-CM | POA: Diagnosis not present

## 2019-12-27 DIAGNOSIS — I509 Heart failure, unspecified: Secondary | ICD-10-CM | POA: Diagnosis not present

## 2020-01-06 DIAGNOSIS — I5032 Chronic diastolic (congestive) heart failure: Secondary | ICD-10-CM | POA: Diagnosis not present

## 2020-01-06 DIAGNOSIS — E1122 Type 2 diabetes mellitus with diabetic chronic kidney disease: Secondary | ICD-10-CM | POA: Diagnosis not present

## 2020-01-06 DIAGNOSIS — N183 Chronic kidney disease, stage 3 unspecified: Secondary | ICD-10-CM | POA: Diagnosis not present

## 2020-01-06 DIAGNOSIS — I130 Hypertensive heart and chronic kidney disease with heart failure and stage 1 through stage 4 chronic kidney disease, or unspecified chronic kidney disease: Secondary | ICD-10-CM | POA: Diagnosis not present

## 2020-03-07 DIAGNOSIS — N183 Chronic kidney disease, stage 3 unspecified: Secondary | ICD-10-CM | POA: Diagnosis not present

## 2020-03-07 DIAGNOSIS — I130 Hypertensive heart and chronic kidney disease with heart failure and stage 1 through stage 4 chronic kidney disease, or unspecified chronic kidney disease: Secondary | ICD-10-CM | POA: Diagnosis not present

## 2020-03-07 DIAGNOSIS — E1122 Type 2 diabetes mellitus with diabetic chronic kidney disease: Secondary | ICD-10-CM | POA: Diagnosis not present

## 2020-03-07 DIAGNOSIS — I5032 Chronic diastolic (congestive) heart failure: Secondary | ICD-10-CM | POA: Diagnosis not present

## 2020-03-08 DIAGNOSIS — Z299 Encounter for prophylactic measures, unspecified: Secondary | ICD-10-CM | POA: Diagnosis not present

## 2020-03-08 DIAGNOSIS — Z87891 Personal history of nicotine dependence: Secondary | ICD-10-CM | POA: Diagnosis not present

## 2020-03-08 DIAGNOSIS — E1165 Type 2 diabetes mellitus with hyperglycemia: Secondary | ICD-10-CM | POA: Diagnosis not present

## 2020-03-08 DIAGNOSIS — J019 Acute sinusitis, unspecified: Secondary | ICD-10-CM | POA: Diagnosis not present

## 2020-03-08 DIAGNOSIS — I10 Essential (primary) hypertension: Secondary | ICD-10-CM | POA: Diagnosis not present

## 2020-04-04 DIAGNOSIS — Z299 Encounter for prophylactic measures, unspecified: Secondary | ICD-10-CM | POA: Diagnosis not present

## 2020-04-04 DIAGNOSIS — I10 Essential (primary) hypertension: Secondary | ICD-10-CM | POA: Diagnosis not present

## 2020-04-04 DIAGNOSIS — E1165 Type 2 diabetes mellitus with hyperglycemia: Secondary | ICD-10-CM | POA: Diagnosis not present

## 2020-04-04 DIAGNOSIS — I509 Heart failure, unspecified: Secondary | ICD-10-CM | POA: Diagnosis not present

## 2020-04-04 DIAGNOSIS — E1122 Type 2 diabetes mellitus with diabetic chronic kidney disease: Secondary | ICD-10-CM | POA: Diagnosis not present

## 2020-04-07 DIAGNOSIS — N183 Chronic kidney disease, stage 3 unspecified: Secondary | ICD-10-CM | POA: Diagnosis not present

## 2020-04-07 DIAGNOSIS — E1122 Type 2 diabetes mellitus with diabetic chronic kidney disease: Secondary | ICD-10-CM | POA: Diagnosis not present

## 2020-04-07 DIAGNOSIS — I5032 Chronic diastolic (congestive) heart failure: Secondary | ICD-10-CM | POA: Diagnosis not present

## 2020-04-07 DIAGNOSIS — I130 Hypertensive heart and chronic kidney disease with heart failure and stage 1 through stage 4 chronic kidney disease, or unspecified chronic kidney disease: Secondary | ICD-10-CM | POA: Diagnosis not present

## 2020-05-08 DIAGNOSIS — I130 Hypertensive heart and chronic kidney disease with heart failure and stage 1 through stage 4 chronic kidney disease, or unspecified chronic kidney disease: Secondary | ICD-10-CM | POA: Diagnosis not present

## 2020-05-08 DIAGNOSIS — N183 Chronic kidney disease, stage 3 unspecified: Secondary | ICD-10-CM | POA: Diagnosis not present

## 2020-05-08 DIAGNOSIS — I5032 Chronic diastolic (congestive) heart failure: Secondary | ICD-10-CM | POA: Diagnosis not present

## 2020-05-08 DIAGNOSIS — E1122 Type 2 diabetes mellitus with diabetic chronic kidney disease: Secondary | ICD-10-CM | POA: Diagnosis not present

## 2020-05-25 DIAGNOSIS — E7800 Pure hypercholesterolemia, unspecified: Secondary | ICD-10-CM | POA: Diagnosis not present

## 2020-05-25 DIAGNOSIS — I10 Essential (primary) hypertension: Secondary | ICD-10-CM | POA: Diagnosis not present

## 2020-05-25 DIAGNOSIS — E119 Type 2 diabetes mellitus without complications: Secondary | ICD-10-CM | POA: Diagnosis not present

## 2020-05-25 DIAGNOSIS — Z7189 Other specified counseling: Secondary | ICD-10-CM | POA: Diagnosis not present

## 2020-05-25 DIAGNOSIS — Z79899 Other long term (current) drug therapy: Secondary | ICD-10-CM | POA: Diagnosis not present

## 2020-05-25 DIAGNOSIS — R5383 Other fatigue: Secondary | ICD-10-CM | POA: Diagnosis not present

## 2020-05-25 DIAGNOSIS — I509 Heart failure, unspecified: Secondary | ICD-10-CM | POA: Diagnosis not present

## 2020-05-25 DIAGNOSIS — Z0000 Encounter for general adult medical examination without abnormal findings: Secondary | ICD-10-CM | POA: Diagnosis not present

## 2020-05-25 DIAGNOSIS — Z299 Encounter for prophylactic measures, unspecified: Secondary | ICD-10-CM | POA: Diagnosis not present

## 2020-05-25 DIAGNOSIS — R030 Elevated blood-pressure reading, without diagnosis of hypertension: Secondary | ICD-10-CM | POA: Diagnosis not present

## 2020-05-25 DIAGNOSIS — E559 Vitamin D deficiency, unspecified: Secondary | ICD-10-CM | POA: Diagnosis not present

## 2020-07-05 DIAGNOSIS — I5032 Chronic diastolic (congestive) heart failure: Secondary | ICD-10-CM | POA: Diagnosis not present

## 2020-07-05 DIAGNOSIS — N183 Chronic kidney disease, stage 3 unspecified: Secondary | ICD-10-CM | POA: Diagnosis not present

## 2020-07-05 DIAGNOSIS — J449 Chronic obstructive pulmonary disease, unspecified: Secondary | ICD-10-CM | POA: Diagnosis not present

## 2020-07-05 DIAGNOSIS — E1122 Type 2 diabetes mellitus with diabetic chronic kidney disease: Secondary | ICD-10-CM | POA: Diagnosis not present

## 2020-07-06 ENCOUNTER — Other Ambulatory Visit: Payer: Self-pay | Admitting: Internal Medicine

## 2020-07-06 DIAGNOSIS — E1165 Type 2 diabetes mellitus with hyperglycemia: Secondary | ICD-10-CM | POA: Diagnosis not present

## 2020-07-06 DIAGNOSIS — E1122 Type 2 diabetes mellitus with diabetic chronic kidney disease: Secondary | ICD-10-CM | POA: Diagnosis not present

## 2020-07-06 DIAGNOSIS — I10 Essential (primary) hypertension: Secondary | ICD-10-CM | POA: Diagnosis not present

## 2020-07-06 DIAGNOSIS — I739 Peripheral vascular disease, unspecified: Secondary | ICD-10-CM | POA: Diagnosis not present

## 2020-07-06 DIAGNOSIS — Z299 Encounter for prophylactic measures, unspecified: Secondary | ICD-10-CM | POA: Diagnosis not present

## 2020-07-06 DIAGNOSIS — Z1231 Encounter for screening mammogram for malignant neoplasm of breast: Secondary | ICD-10-CM

## 2020-07-06 DIAGNOSIS — I509 Heart failure, unspecified: Secondary | ICD-10-CM | POA: Diagnosis not present

## 2020-10-05 DIAGNOSIS — E559 Vitamin D deficiency, unspecified: Secondary | ICD-10-CM | POA: Diagnosis not present

## 2020-10-05 DIAGNOSIS — M1990 Unspecified osteoarthritis, unspecified site: Secondary | ICD-10-CM | POA: Diagnosis not present

## 2020-10-05 DIAGNOSIS — M810 Age-related osteoporosis without current pathological fracture: Secondary | ICD-10-CM | POA: Diagnosis not present

## 2020-10-05 DIAGNOSIS — I10 Essential (primary) hypertension: Secondary | ICD-10-CM | POA: Diagnosis not present

## 2020-10-13 DIAGNOSIS — E1165 Type 2 diabetes mellitus with hyperglycemia: Secondary | ICD-10-CM | POA: Diagnosis not present

## 2020-10-13 DIAGNOSIS — I10 Essential (primary) hypertension: Secondary | ICD-10-CM | POA: Diagnosis not present

## 2020-10-13 DIAGNOSIS — Z299 Encounter for prophylactic measures, unspecified: Secondary | ICD-10-CM | POA: Diagnosis not present

## 2020-10-13 DIAGNOSIS — E1122 Type 2 diabetes mellitus with diabetic chronic kidney disease: Secondary | ICD-10-CM | POA: Diagnosis not present

## 2020-12-06 DIAGNOSIS — I10 Essential (primary) hypertension: Secondary | ICD-10-CM | POA: Diagnosis not present

## 2020-12-06 DIAGNOSIS — E559 Vitamin D deficiency, unspecified: Secondary | ICD-10-CM | POA: Diagnosis not present

## 2020-12-06 DIAGNOSIS — M810 Age-related osteoporosis without current pathological fracture: Secondary | ICD-10-CM | POA: Diagnosis not present

## 2020-12-06 DIAGNOSIS — M1990 Unspecified osteoarthritis, unspecified site: Secondary | ICD-10-CM | POA: Diagnosis not present

## 2021-01-05 DIAGNOSIS — M810 Age-related osteoporosis without current pathological fracture: Secondary | ICD-10-CM | POA: Diagnosis not present

## 2021-01-05 DIAGNOSIS — I10 Essential (primary) hypertension: Secondary | ICD-10-CM | POA: Diagnosis not present

## 2021-01-17 DIAGNOSIS — Z87891 Personal history of nicotine dependence: Secondary | ICD-10-CM | POA: Diagnosis not present

## 2021-01-17 DIAGNOSIS — E1122 Type 2 diabetes mellitus with diabetic chronic kidney disease: Secondary | ICD-10-CM | POA: Diagnosis not present

## 2021-01-17 DIAGNOSIS — Z299 Encounter for prophylactic measures, unspecified: Secondary | ICD-10-CM | POA: Diagnosis not present

## 2021-01-17 DIAGNOSIS — I10 Essential (primary) hypertension: Secondary | ICD-10-CM | POA: Diagnosis not present

## 2021-01-17 DIAGNOSIS — K5900 Constipation, unspecified: Secondary | ICD-10-CM | POA: Diagnosis not present

## 2021-01-17 DIAGNOSIS — Z23 Encounter for immunization: Secondary | ICD-10-CM | POA: Diagnosis not present

## 2021-01-17 DIAGNOSIS — E1165 Type 2 diabetes mellitus with hyperglycemia: Secondary | ICD-10-CM | POA: Diagnosis not present

## 2021-02-05 DIAGNOSIS — M1990 Unspecified osteoarthritis, unspecified site: Secondary | ICD-10-CM | POA: Diagnosis not present

## 2021-02-05 DIAGNOSIS — I10 Essential (primary) hypertension: Secondary | ICD-10-CM | POA: Diagnosis not present

## 2021-02-05 DIAGNOSIS — E559 Vitamin D deficiency, unspecified: Secondary | ICD-10-CM | POA: Diagnosis not present

## 2021-02-05 DIAGNOSIS — M810 Age-related osteoporosis without current pathological fracture: Secondary | ICD-10-CM | POA: Diagnosis not present

## 2021-03-07 DIAGNOSIS — I10 Essential (primary) hypertension: Secondary | ICD-10-CM | POA: Diagnosis not present

## 2021-03-07 DIAGNOSIS — E7800 Pure hypercholesterolemia, unspecified: Secondary | ICD-10-CM | POA: Diagnosis not present

## 2021-04-23 DIAGNOSIS — E1165 Type 2 diabetes mellitus with hyperglycemia: Secondary | ICD-10-CM | POA: Diagnosis not present

## 2021-04-23 DIAGNOSIS — Z299 Encounter for prophylactic measures, unspecified: Secondary | ICD-10-CM | POA: Diagnosis not present

## 2021-04-23 DIAGNOSIS — Z87891 Personal history of nicotine dependence: Secondary | ICD-10-CM | POA: Diagnosis not present

## 2021-04-23 DIAGNOSIS — R809 Proteinuria, unspecified: Secondary | ICD-10-CM | POA: Diagnosis not present

## 2021-04-23 DIAGNOSIS — I10 Essential (primary) hypertension: Secondary | ICD-10-CM | POA: Diagnosis not present

## 2021-04-23 DIAGNOSIS — E1129 Type 2 diabetes mellitus with other diabetic kidney complication: Secondary | ICD-10-CM | POA: Diagnosis not present

## 2021-05-28 DIAGNOSIS — E559 Vitamin D deficiency, unspecified: Secondary | ICD-10-CM | POA: Diagnosis not present

## 2021-05-28 DIAGNOSIS — Z299 Encounter for prophylactic measures, unspecified: Secondary | ICD-10-CM | POA: Diagnosis not present

## 2021-05-28 DIAGNOSIS — I509 Heart failure, unspecified: Secondary | ICD-10-CM | POA: Diagnosis not present

## 2021-05-28 DIAGNOSIS — E7800 Pure hypercholesterolemia, unspecified: Secondary | ICD-10-CM | POA: Diagnosis not present

## 2021-05-28 DIAGNOSIS — Z1231 Encounter for screening mammogram for malignant neoplasm of breast: Secondary | ICD-10-CM | POA: Diagnosis not present

## 2021-05-28 DIAGNOSIS — R5383 Other fatigue: Secondary | ICD-10-CM | POA: Diagnosis not present

## 2021-05-28 DIAGNOSIS — I10 Essential (primary) hypertension: Secondary | ICD-10-CM | POA: Diagnosis not present

## 2021-05-28 DIAGNOSIS — Z0000 Encounter for general adult medical examination without abnormal findings: Secondary | ICD-10-CM | POA: Diagnosis not present

## 2021-05-28 DIAGNOSIS — Z87891 Personal history of nicotine dependence: Secondary | ICD-10-CM | POA: Diagnosis not present

## 2021-05-28 DIAGNOSIS — Z79899 Other long term (current) drug therapy: Secondary | ICD-10-CM | POA: Diagnosis not present

## 2021-05-28 DIAGNOSIS — Z7189 Other specified counseling: Secondary | ICD-10-CM | POA: Diagnosis not present

## 2021-07-25 DIAGNOSIS — Z0000 Encounter for general adult medical examination without abnormal findings: Secondary | ICD-10-CM | POA: Diagnosis not present

## 2021-07-25 DIAGNOSIS — I509 Heart failure, unspecified: Secondary | ICD-10-CM | POA: Diagnosis not present

## 2021-07-25 DIAGNOSIS — I10 Essential (primary) hypertension: Secondary | ICD-10-CM | POA: Diagnosis not present

## 2021-07-25 DIAGNOSIS — Z299 Encounter for prophylactic measures, unspecified: Secondary | ICD-10-CM | POA: Diagnosis not present

## 2021-07-25 DIAGNOSIS — E1165 Type 2 diabetes mellitus with hyperglycemia: Secondary | ICD-10-CM | POA: Diagnosis not present

## 2021-07-25 DIAGNOSIS — K5900 Constipation, unspecified: Secondary | ICD-10-CM | POA: Diagnosis not present

## 2021-07-25 DIAGNOSIS — N1832 Chronic kidney disease, stage 3b: Secondary | ICD-10-CM | POA: Diagnosis not present

## 2021-09-05 DIAGNOSIS — M1990 Unspecified osteoarthritis, unspecified site: Secondary | ICD-10-CM | POA: Diagnosis not present

## 2021-09-05 DIAGNOSIS — E559 Vitamin D deficiency, unspecified: Secondary | ICD-10-CM | POA: Diagnosis not present

## 2021-09-05 DIAGNOSIS — I10 Essential (primary) hypertension: Secondary | ICD-10-CM | POA: Diagnosis not present

## 2021-09-05 DIAGNOSIS — M810 Age-related osteoporosis without current pathological fracture: Secondary | ICD-10-CM | POA: Diagnosis not present

## 2021-11-02 DIAGNOSIS — Z299 Encounter for prophylactic measures, unspecified: Secondary | ICD-10-CM | POA: Diagnosis not present

## 2021-11-02 DIAGNOSIS — L659 Nonscarring hair loss, unspecified: Secondary | ICD-10-CM | POA: Diagnosis not present

## 2021-11-02 DIAGNOSIS — Z1211 Encounter for screening for malignant neoplasm of colon: Secondary | ICD-10-CM | POA: Diagnosis not present

## 2021-11-02 DIAGNOSIS — I10 Essential (primary) hypertension: Secondary | ICD-10-CM | POA: Diagnosis not present

## 2021-11-02 DIAGNOSIS — E1165 Type 2 diabetes mellitus with hyperglycemia: Secondary | ICD-10-CM | POA: Diagnosis not present

## 2021-11-02 DIAGNOSIS — M1712 Unilateral primary osteoarthritis, left knee: Secondary | ICD-10-CM | POA: Diagnosis not present

## 2021-12-06 DIAGNOSIS — N183 Chronic kidney disease, stage 3 unspecified: Secondary | ICD-10-CM | POA: Diagnosis not present

## 2021-12-06 DIAGNOSIS — I5032 Chronic diastolic (congestive) heart failure: Secondary | ICD-10-CM | POA: Diagnosis not present

## 2021-12-06 DIAGNOSIS — E1122 Type 2 diabetes mellitus with diabetic chronic kidney disease: Secondary | ICD-10-CM | POA: Diagnosis not present

## 2021-12-06 DIAGNOSIS — I130 Hypertensive heart and chronic kidney disease with heart failure and stage 1 through stage 4 chronic kidney disease, or unspecified chronic kidney disease: Secondary | ICD-10-CM | POA: Diagnosis not present

## 2022-02-15 DIAGNOSIS — Z713 Dietary counseling and surveillance: Secondary | ICD-10-CM | POA: Diagnosis not present

## 2022-02-15 DIAGNOSIS — I10 Essential (primary) hypertension: Secondary | ICD-10-CM | POA: Diagnosis not present

## 2022-02-15 DIAGNOSIS — Z299 Encounter for prophylactic measures, unspecified: Secondary | ICD-10-CM | POA: Diagnosis not present

## 2022-02-15 DIAGNOSIS — E1165 Type 2 diabetes mellitus with hyperglycemia: Secondary | ICD-10-CM | POA: Diagnosis not present

## 2022-03-20 DIAGNOSIS — I10 Essential (primary) hypertension: Secondary | ICD-10-CM | POA: Diagnosis not present

## 2022-03-20 DIAGNOSIS — Z299 Encounter for prophylactic measures, unspecified: Secondary | ICD-10-CM | POA: Diagnosis not present

## 2022-03-20 DIAGNOSIS — M549 Dorsalgia, unspecified: Secondary | ICD-10-CM | POA: Diagnosis not present

## 2022-03-20 DIAGNOSIS — I509 Heart failure, unspecified: Secondary | ICD-10-CM | POA: Diagnosis not present

## 2022-03-28 DIAGNOSIS — I10 Essential (primary) hypertension: Secondary | ICD-10-CM | POA: Diagnosis not present

## 2022-03-28 DIAGNOSIS — Z299 Encounter for prophylactic measures, unspecified: Secondary | ICD-10-CM | POA: Diagnosis not present

## 2022-03-28 DIAGNOSIS — E1165 Type 2 diabetes mellitus with hyperglycemia: Secondary | ICD-10-CM | POA: Diagnosis not present

## 2022-03-28 DIAGNOSIS — R109 Unspecified abdominal pain: Secondary | ICD-10-CM | POA: Diagnosis not present

## 2022-04-02 DIAGNOSIS — R109 Unspecified abdominal pain: Secondary | ICD-10-CM | POA: Diagnosis not present

## 2022-04-03 DIAGNOSIS — I509 Heart failure, unspecified: Secondary | ICD-10-CM | POA: Diagnosis not present

## 2022-04-03 DIAGNOSIS — M549 Dorsalgia, unspecified: Secondary | ICD-10-CM | POA: Diagnosis not present

## 2022-04-03 DIAGNOSIS — I10 Essential (primary) hypertension: Secondary | ICD-10-CM | POA: Diagnosis not present

## 2022-04-03 DIAGNOSIS — Z299 Encounter for prophylactic measures, unspecified: Secondary | ICD-10-CM | POA: Diagnosis not present

## 2022-04-12 DIAGNOSIS — M47896 Other spondylosis, lumbar region: Secondary | ICD-10-CM | POA: Diagnosis not present

## 2022-04-12 DIAGNOSIS — M5126 Other intervertebral disc displacement, lumbar region: Secondary | ICD-10-CM | POA: Diagnosis not present

## 2022-05-07 DIAGNOSIS — H6090 Unspecified otitis externa, unspecified ear: Secondary | ICD-10-CM | POA: Diagnosis not present

## 2022-05-07 DIAGNOSIS — E1165 Type 2 diabetes mellitus with hyperglycemia: Secondary | ICD-10-CM | POA: Diagnosis not present

## 2022-05-07 DIAGNOSIS — M549 Dorsalgia, unspecified: Secondary | ICD-10-CM | POA: Diagnosis not present

## 2022-05-07 DIAGNOSIS — I10 Essential (primary) hypertension: Secondary | ICD-10-CM | POA: Diagnosis not present

## 2022-05-07 DIAGNOSIS — Z299 Encounter for prophylactic measures, unspecified: Secondary | ICD-10-CM | POA: Diagnosis not present

## 2022-05-10 DIAGNOSIS — S39011A Strain of muscle, fascia and tendon of abdomen, initial encounter: Secondary | ICD-10-CM | POA: Diagnosis not present

## 2022-05-24 DIAGNOSIS — Z7189 Other specified counseling: Secondary | ICD-10-CM | POA: Diagnosis not present

## 2022-05-24 DIAGNOSIS — E1165 Type 2 diabetes mellitus with hyperglycemia: Secondary | ICD-10-CM | POA: Diagnosis not present

## 2022-05-24 DIAGNOSIS — Z87891 Personal history of nicotine dependence: Secondary | ICD-10-CM | POA: Diagnosis not present

## 2022-05-24 DIAGNOSIS — Z299 Encounter for prophylactic measures, unspecified: Secondary | ICD-10-CM | POA: Diagnosis not present

## 2022-05-24 DIAGNOSIS — E559 Vitamin D deficiency, unspecified: Secondary | ICD-10-CM | POA: Diagnosis not present

## 2022-05-24 DIAGNOSIS — I10 Essential (primary) hypertension: Secondary | ICD-10-CM | POA: Diagnosis not present

## 2022-05-24 DIAGNOSIS — R5383 Other fatigue: Secondary | ICD-10-CM | POA: Diagnosis not present

## 2022-05-24 DIAGNOSIS — Z0000 Encounter for general adult medical examination without abnormal findings: Secondary | ICD-10-CM | POA: Diagnosis not present

## 2022-05-24 DIAGNOSIS — Z79899 Other long term (current) drug therapy: Secondary | ICD-10-CM | POA: Diagnosis not present

## 2022-05-24 DIAGNOSIS — E7800 Pure hypercholesterolemia, unspecified: Secondary | ICD-10-CM | POA: Diagnosis not present

## 2022-05-24 DIAGNOSIS — M549 Dorsalgia, unspecified: Secondary | ICD-10-CM | POA: Diagnosis not present

## 2022-05-28 DIAGNOSIS — R7989 Other specified abnormal findings of blood chemistry: Secondary | ICD-10-CM | POA: Diagnosis not present

## 2022-08-29 DIAGNOSIS — I739 Peripheral vascular disease, unspecified: Secondary | ICD-10-CM | POA: Diagnosis not present

## 2022-08-29 DIAGNOSIS — E1165 Type 2 diabetes mellitus with hyperglycemia: Secondary | ICD-10-CM | POA: Diagnosis not present

## 2022-08-29 DIAGNOSIS — I10 Essential (primary) hypertension: Secondary | ICD-10-CM | POA: Diagnosis not present

## 2022-08-29 DIAGNOSIS — Z299 Encounter for prophylactic measures, unspecified: Secondary | ICD-10-CM | POA: Diagnosis not present

## 2022-08-29 DIAGNOSIS — Z0000 Encounter for general adult medical examination without abnormal findings: Secondary | ICD-10-CM | POA: Diagnosis not present

## 2022-08-29 DIAGNOSIS — I509 Heart failure, unspecified: Secondary | ICD-10-CM | POA: Diagnosis not present

## 2022-12-03 DIAGNOSIS — E1122 Type 2 diabetes mellitus with diabetic chronic kidney disease: Secondary | ICD-10-CM | POA: Diagnosis not present

## 2022-12-03 DIAGNOSIS — Z299 Encounter for prophylactic measures, unspecified: Secondary | ICD-10-CM | POA: Diagnosis not present

## 2022-12-03 DIAGNOSIS — E1165 Type 2 diabetes mellitus with hyperglycemia: Secondary | ICD-10-CM | POA: Diagnosis not present

## 2022-12-03 DIAGNOSIS — I10 Essential (primary) hypertension: Secondary | ICD-10-CM | POA: Diagnosis not present

## 2022-12-03 DIAGNOSIS — I509 Heart failure, unspecified: Secondary | ICD-10-CM | POA: Diagnosis not present

## 2022-12-03 DIAGNOSIS — N183 Chronic kidney disease, stage 3 unspecified: Secondary | ICD-10-CM | POA: Diagnosis not present

## 2023-02-18 ENCOUNTER — Telehealth: Payer: Self-pay

## 2023-02-18 NOTE — Patient Outreach (Signed)
Successful call to patient on today regarding preventative mammogram screening. Patient declined at this time and will follow up with PCP at later date.  Baruch Gouty [facility] Assistant [facility] [phone]

## 2023-03-20 DIAGNOSIS — E1122 Type 2 diabetes mellitus with diabetic chronic kidney disease: Secondary | ICD-10-CM | POA: Diagnosis not present

## 2023-03-20 DIAGNOSIS — Z299 Encounter for prophylactic measures, unspecified: Secondary | ICD-10-CM | POA: Diagnosis not present

## 2023-03-20 DIAGNOSIS — I10 Essential (primary) hypertension: Secondary | ICD-10-CM | POA: Diagnosis not present

## 2023-03-20 DIAGNOSIS — N1832 Chronic kidney disease, stage 3b: Secondary | ICD-10-CM | POA: Diagnosis not present

## 2023-03-20 DIAGNOSIS — E1169 Type 2 diabetes mellitus with other specified complication: Secondary | ICD-10-CM | POA: Diagnosis not present

## 2023-03-20 DIAGNOSIS — I509 Heart failure, unspecified: Secondary | ICD-10-CM | POA: Diagnosis not present

## 2023-06-06 DIAGNOSIS — Z79899 Other long term (current) drug therapy: Secondary | ICD-10-CM | POA: Diagnosis not present

## 2023-06-06 DIAGNOSIS — Z0000 Encounter for general adult medical examination without abnormal findings: Secondary | ICD-10-CM | POA: Diagnosis not present

## 2023-06-06 DIAGNOSIS — R52 Pain, unspecified: Secondary | ICD-10-CM | POA: Diagnosis not present

## 2023-06-06 DIAGNOSIS — R5383 Other fatigue: Secondary | ICD-10-CM | POA: Diagnosis not present

## 2023-06-06 DIAGNOSIS — Z299 Encounter for prophylactic measures, unspecified: Secondary | ICD-10-CM | POA: Diagnosis not present

## 2023-06-06 DIAGNOSIS — I10 Essential (primary) hypertension: Secondary | ICD-10-CM | POA: Diagnosis not present

## 2023-06-06 DIAGNOSIS — E7800 Pure hypercholesterolemia, unspecified: Secondary | ICD-10-CM | POA: Diagnosis not present

## 2023-06-06 DIAGNOSIS — Z7189 Other specified counseling: Secondary | ICD-10-CM | POA: Diagnosis not present

## 2023-06-06 DIAGNOSIS — E559 Vitamin D deficiency, unspecified: Secondary | ICD-10-CM | POA: Diagnosis not present

## 2023-06-23 DIAGNOSIS — R7989 Other specified abnormal findings of blood chemistry: Secondary | ICD-10-CM | POA: Diagnosis not present

## 2023-06-23 DIAGNOSIS — R5383 Other fatigue: Secondary | ICD-10-CM | POA: Diagnosis not present

## 2023-06-23 DIAGNOSIS — R809 Proteinuria, unspecified: Secondary | ICD-10-CM | POA: Diagnosis not present

## 2023-06-23 DIAGNOSIS — E1122 Type 2 diabetes mellitus with diabetic chronic kidney disease: Secondary | ICD-10-CM | POA: Diagnosis not present

## 2023-06-23 DIAGNOSIS — E1169 Type 2 diabetes mellitus with other specified complication: Secondary | ICD-10-CM | POA: Diagnosis not present

## 2023-06-23 DIAGNOSIS — I10 Essential (primary) hypertension: Secondary | ICD-10-CM | POA: Diagnosis not present

## 2023-06-23 DIAGNOSIS — Z299 Encounter for prophylactic measures, unspecified: Secondary | ICD-10-CM | POA: Diagnosis not present

## 2023-06-23 DIAGNOSIS — E1129 Type 2 diabetes mellitus with other diabetic kidney complication: Secondary | ICD-10-CM | POA: Diagnosis not present

## 2023-10-02 DIAGNOSIS — E1165 Type 2 diabetes mellitus with hyperglycemia: Secondary | ICD-10-CM | POA: Diagnosis not present

## 2023-10-02 DIAGNOSIS — N1832 Chronic kidney disease, stage 3b: Secondary | ICD-10-CM | POA: Diagnosis not present

## 2023-10-02 DIAGNOSIS — I10 Essential (primary) hypertension: Secondary | ICD-10-CM | POA: Diagnosis not present

## 2023-10-02 DIAGNOSIS — I509 Heart failure, unspecified: Secondary | ICD-10-CM | POA: Diagnosis not present

## 2023-10-02 DIAGNOSIS — Z299 Encounter for prophylactic measures, unspecified: Secondary | ICD-10-CM | POA: Diagnosis not present

## 2023-10-02 DIAGNOSIS — R52 Pain, unspecified: Secondary | ICD-10-CM | POA: Diagnosis not present

## 2023-11-17 DIAGNOSIS — Z299 Encounter for prophylactic measures, unspecified: Secondary | ICD-10-CM | POA: Diagnosis not present

## 2023-11-17 DIAGNOSIS — E1169 Type 2 diabetes mellitus with other specified complication: Secondary | ICD-10-CM | POA: Diagnosis not present

## 2023-11-17 DIAGNOSIS — Z0000 Encounter for general adult medical examination without abnormal findings: Secondary | ICD-10-CM | POA: Diagnosis not present

## 2023-11-17 DIAGNOSIS — I10 Essential (primary) hypertension: Secondary | ICD-10-CM | POA: Diagnosis not present

## 2023-11-17 DIAGNOSIS — R52 Pain, unspecified: Secondary | ICD-10-CM | POA: Diagnosis not present

## 2023-11-17 DIAGNOSIS — N183 Chronic kidney disease, stage 3 unspecified: Secondary | ICD-10-CM | POA: Diagnosis not present

## 2024-01-05 DIAGNOSIS — I10 Essential (primary) hypertension: Secondary | ICD-10-CM | POA: Diagnosis not present

## 2024-01-05 DIAGNOSIS — K219 Gastro-esophageal reflux disease without esophagitis: Secondary | ICD-10-CM | POA: Diagnosis not present

## 2024-01-05 DIAGNOSIS — N183 Chronic kidney disease, stage 3 unspecified: Secondary | ICD-10-CM | POA: Diagnosis not present

## 2024-01-05 DIAGNOSIS — I509 Heart failure, unspecified: Secondary | ICD-10-CM | POA: Diagnosis not present

## 2024-01-05 DIAGNOSIS — Z299 Encounter for prophylactic measures, unspecified: Secondary | ICD-10-CM | POA: Diagnosis not present

## 2024-01-05 DIAGNOSIS — E119 Type 2 diabetes mellitus without complications: Secondary | ICD-10-CM | POA: Diagnosis not present

## 2024-01-09 DIAGNOSIS — Z1211 Encounter for screening for malignant neoplasm of colon: Secondary | ICD-10-CM | POA: Diagnosis not present

## 2024-01-26 DIAGNOSIS — Z1211 Encounter for screening for malignant neoplasm of colon: Secondary | ICD-10-CM | POA: Diagnosis not present
